# Patient Record
Sex: Male | Born: 1990 | Race: White | Hispanic: No | Marital: Single | Smoking: Never smoker
Health system: Southern US, Community
[De-identification: ages and names within clinical notes are randomized; demographics above are authoritative.]

## PROBLEM LIST (undated history)

## (undated) DIAGNOSIS — T7840XA Allergy, unspecified, initial encounter: Secondary | ICD-10-CM

## (undated) HISTORY — PX: WISDOM TOOTH EXTRACTION: SHX21

## (undated) HISTORY — DX: Allergy, unspecified, initial encounter: T78.40XA

---
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
## 2012-06-12 ENCOUNTER — Ambulatory Visit (INDEPENDENT_AMBULATORY_CARE_PROVIDER_SITE_OTHER): Payer: Federal, State, Local not specified - PPO | Admitting: Emergency Medicine

## 2012-06-12 ENCOUNTER — Ambulatory Visit: Payer: Federal, State, Local not specified - PPO

## 2012-06-12 VITALS — BP 123/67 | HR 85 | Temp 98.2°F | Resp 16 | Ht 72.0 in | Wt 185.0 lb

## 2012-06-12 DIAGNOSIS — S93409A Sprain of unspecified ligament of unspecified ankle, initial encounter: Secondary | ICD-10-CM

## 2012-06-12 DIAGNOSIS — M25579 Pain in unspecified ankle and joints of unspecified foot: Secondary | ICD-10-CM

## 2012-06-12 DIAGNOSIS — J111 Influenza due to unidentified influenza virus with other respiratory manifestations: Secondary | ICD-10-CM

## 2012-06-12 MED ORDER — NAPROXEN SODIUM 550 MG PO TABS: 550.0000 mg | ORAL_TABLET | Freq: Two times a day (BID) | ORAL | Status: AC

## 2012-06-12 MED ORDER — HYDROCODONE-ACETAMINOPHEN 5-325 MG PO TABS: 1.0000 | ORAL_TABLET | Freq: Four times a day (QID) | ORAL | Status: AC | PRN

## 2012-06-12 NOTE — Patient Instructions (Addendum)
Ankle Sprain  An ankle sprain is an injury to the strong, fibrous tissues (ligaments) that hold the bones of your ankle joint together.   CAUSES  An ankle sprain is usually caused by a fall or by twisting your ankle. Ankle sprains most commonly occur when you step on the outer edge of your foot, and your ankle turns inward. People who participate in sports are more prone to these types of injuries.   SYMPTOMS    Pain in your ankle. The pain may be present at rest or only when you are trying to stand or walk.   Swelling.   Bruising. Bruising may develop immediately or within 1 to 2 days after your injury.   Difficulty standing or walking, particularly when turning corners or changing directions.  DIAGNOSIS   Your caregiver will ask you details about your injury and perform a physical exam of your ankle to determine if you have an ankle sprain. During the physical exam, your caregiver will press on and apply pressure to specific areas of your foot and ankle. Your caregiver will try to move your ankle in certain ways. An X-ray exam may be done to be sure a bone was not broken or a ligament did not separate from one of the bones in your ankle (avulsion fracture).   TREATMENT   Certain types of braces can help stabilize your ankle. Your caregiver can make a recommendation for this. Your caregiver may recommend the use of medicine for pain. If your sprain is severe, your caregiver may refer you to a surgeon who helps to restore function to parts of your skeletal system (orthopedist) or a physical therapist.  HOME CARE INSTRUCTIONS    Apply ice to your injury for 1 to 2 days or as directed by your caregiver. Applying ice helps to reduce inflammation and pain.   Put ice in a plastic bag.   Place a towel between your skin and the bag.   Leave the ice on for 15 to 20 minutes at a time, every 2 hours while you are awake.   Only take over-the-counter or prescription medicines for pain, discomfort, or fever as directed  by your caregiver.   Keep your injured leg elevated, when possible, to lessen swelling.   If your caregiver recommends crutches, use them as instructed. Gradually put weight on the affected ankle. Continue to use crutches or a cane until you can walk without feeling pain in your ankle.   If you have a plaster splint, wear the splint as directed by your caregiver. Do not rest it on anything harder than a pillow for the first 24 hours. Do not put weight on it. Do not get it wet. You may take it off to take a shower or bath.   You may have been given an elastic bandage to wear around your ankle to provide support. If the elastic bandage is too tight (you have numbness or tingling in your foot or your foot becomes cold and blue), adjust the bandage to make it comfortable.   If you have an air splint, you may blow more air into it or let air out to make it more comfortable. You may take your splint off at night and before taking a shower or bath.   Wiggle your toes in the splint several times per day to decrease swelling.  SEEK MEDICAL CARE IF:    You have an increase in bruising, swelling, or pain.   Your toes feel extremely cold   or you lose feeling in your foot.   Your pain is not relieved with medicine.  SEEK IMMEDIATE MEDICAL CARE IF:   Your toes are numb or blue.   You have severe pain.  MAKE SURE YOU:    Understand these instructions.   Will watch your condition.   Will get help right away if you are not doing well or get worse.  Document Released: 05/18/2005 Document Revised: 08/10/2011 Document Reviewed: 05/30/2011  [employer] Patient Information 2013 [employer].

## 2012-06-12 NOTE — Progress Notes (Signed)
[facility] [address] [place] [address] [phone]  Date:  06/12/2012   Name:  Kevin Bennett   DOB:  1991/04/17   MRN:  [id number]  PCP:  No primary provider on file.    Chief Complaint: Ankle Pain   History of Present Illness:  Kevin Bennett is a 22 y.o. very pleasant male patient who presents with the following:  Running around playing dodge ball and injured his left ankle in an inversion injury and heard a pop.  Now more swollen and painful.  No prior injury to ankle  There is no problem list on file for this patient.   No past medical history on file.  No past surgical history on file.  History  Substance Use Topics  . Smoking status: Never Smoker   . Smokeless tobacco: Not on file  . Alcohol Use: Not on file    No family history on file.  No Known Allergies  Medication list has been reviewed and updated.  No current outpatient prescriptions on file prior to visit.    Review of Systems:  As per HPI, otherwise negative.    Physical Examination: Filed Vitals:   06/12/12 0950  BP: 123/67  Pulse: 85  Temp: 98.2 F (36.8 C)  Resp: 16   Filed Vitals:   06/12/12 0950  Height: 6' (1.829 m)  Weight: 185 lb (83.915 kg)   Body mass index is 25.09 kg/(m^2). Ideal Body Weight: Weight in (lb) to have BMI = 25: 183.9    GEN: WDWN, NAD, Non-toxic, Alert & Oriented x 3 HEENT: Atraumatic, Normocephalic.  Ears and Nose: No external deformity. EXTR: No clubbing/cyanosis/edema NEURO: Normal gait.  PSYCH: Normally interactive. Conversant. Not depressed or anxious appearing.  Calm demeanor.  LEFT ANKLE:  Tender, swollen ankle no deformity or ecchymosis. Not able to bear weight.  Too swollen for good evaluation of ROM.  Assessment and Plan:  Sprain ankle Air cast Crutches WBAT Elevate Ice Anaprox vicodin   Carmelina Dane, MD  [facility] reading (PRIMARY) by  Dr. Dareen Piano.  Negative for fracture.

## 2013-08-15 ENCOUNTER — Ambulatory Visit (INDEPENDENT_AMBULATORY_CARE_PROVIDER_SITE_OTHER): Payer: Federal, State, Local not specified - PPO | Admitting: Emergency Medicine

## 2013-08-15 ENCOUNTER — Other Ambulatory Visit: Payer: Self-pay | Admitting: *Deleted

## 2013-08-15 VITALS — BP 118/70 | HR 110 | Temp 98.1°F | Resp 16 | Ht 71.75 in | Wt 211.0 lb

## 2013-08-15 DIAGNOSIS — J309 Allergic rhinitis, unspecified: Secondary | ICD-10-CM

## 2013-08-15 MED ORDER — TRIAMCINOLONE ACETONIDE 55 MCG/ACT NA AERO: 2.0000 | INHALATION_SPRAY | Freq: Every day | NASAL | Status: AC

## 2013-08-15 MED ORDER — TRIAMCINOLONE ACETONIDE 55 MCG/ACT NA AERO: 2.0000 | INHALATION_SPRAY | Freq: Every day | NASAL | Status: DC

## 2013-08-15 NOTE — Progress Notes (Signed)
[facility] [address], [place] [id number] [phone]  Date:  08/15/2013   Name:  Kevin GarnerDaniel R Bennett   DOB:  December 05, 1990   MRN:  [id number]  PCP:  No primary provider on file.    Chief Complaint: Sinusitis and Sore Throat   History of Present Illness:  Kevin GarnerDaniel R Bennett is a 23 y.o. very pleasant male patient who presents with the following:  Sudden onset of clear nasal drainage and post nasal drainage.  Some hoarseness. No cough, wheezing or shortness of breath.  No fever or chills. No nausea or vomiting.  Occurs every year about this time.  Concerned about developing an infection.  No improvement with over the counter medications or other home remedies. Denies other complaint or health concern today.   There are no active problems to display for this patient.   Past Medical History  Diagnosis Date  . Allergy     Past Surgical History  Procedure Laterality Date  . Wisdom tooth extraction      History  Substance Use Topics  . Smoking status: Never Smoker   . Smokeless tobacco: Not on file  . Alcohol Use: Not on file    No family history on file.  No Known Allergies  Medication list has been reviewed and updated.  Current Outpatient Prescriptions on File Prior to Visit  Medication Sig Dispense Refill  . HYDROcodone-acetaminophen (NORCO) 5-325 MG per tablet Take 1 tablet by mouth every 6 (six) hours as needed for pain.  30 tablet  0   No current facility-administered medications on file prior to visit.    Review of Systems:  As per HPI, otherwise negative.    Physical Examination: Filed Vitals:   08/15/13 1024  BP: 118/70  Pulse: 110  Temp: 98.1 F (36.7 C)  Resp: 16   Filed Vitals:   08/15/13 1024  Height: 5' 11.75" (1.822 m)  Weight: 211 lb (95.709 kg)   Body mass index is 28.83 kg/(m^2). Ideal Body Weight: Weight in (lb) to have BMI = 25: 182.7  GEN: WDWN, NAD, Non-toxic, A & O x 3 HEENT: Atraumatic, Normocephalic. Neck  supple. No masses, No LAD. Ears and Nose: No external deformity. CV: RRR, No M/G/R. No JVD. No thrill. No extra heart sounds. PULM: CTA B, no wheezes, crackles, rhonchi. No retractions. No resp. distress. No accessory muscle use. ABD: S, NT, ND, +BS. No rebound. No HSM. EXTR: No c/c/e NEURO Normal gait.  PSYCH: Normally interactive. Conversant. Not depressed or anxious appearing.  Calm demeanor.    Assessment and Plan: Seasonal allergic rhinitis nasacort Allegra  Signed,  Phillips OdorJeffery Cerissa Zeiger, MD

## 2013-08-15 NOTE — Patient Instructions (Signed)
